# Patient Record
Sex: Female | Born: 2000 | Race: Black or African American | Hispanic: No | Marital: Single | Smoking: Never smoker
Health system: Southern US, Community
[De-identification: ages and names within clinical notes are randomized; demographics above are authoritative.]

---

## 2020-07-08 ENCOUNTER — Encounter (HOSPITAL_COMMUNITY): Payer: Self-pay | Admitting: Emergency Medicine

## 2020-07-08 ENCOUNTER — Emergency Department (HOSPITAL_COMMUNITY)
Admission: EM | Admit: 2020-07-08 | Discharge: 2020-07-08 | Disposition: A | Discharge status: Home / Self Care | Payer: BC Managed Care – PPO | Attending: Emergency Medicine | Admitting: Emergency Medicine

## 2020-07-08 ENCOUNTER — Other Ambulatory Visit: Payer: Self-pay

## 2020-07-08 ENCOUNTER — Emergency Department (HOSPITAL_COMMUNITY): Payer: BC Managed Care – PPO

## 2020-07-08 DIAGNOSIS — R0789 Other chest pain: Secondary | ICD-10-CM | POA: Diagnosis present

## 2020-07-08 DIAGNOSIS — R030 Elevated blood-pressure reading, without diagnosis of hypertension: Secondary | ICD-10-CM | POA: Insufficient documentation

## 2020-07-08 DIAGNOSIS — E876 Hypokalemia: Secondary | ICD-10-CM | POA: Diagnosis not present

## 2020-07-08 LAB — CBC
HCT: 44 % (ref 36.0–46.0)
Hemoglobin: 14.4 g/dL (ref 12.0–15.0)
MCH: 28.7 pg (ref 26.0–34.0)
MCHC: 32.7 g/dL (ref 30.0–36.0)
MCV: 87.8 fL (ref 80.0–100.0)
Platelets: 184 10*3/uL (ref 150–400)
RBC: 5.01 MIL/uL (ref 3.87–5.11)
RDW: 12.5 % (ref 11.5–15.5)
WBC: 4.5 10*3/uL (ref 4.0–10.5)
nRBC: 0 % (ref 0.0–0.2)

## 2020-07-08 LAB — BASIC METABOLIC PANEL
Anion gap: 12 (ref 5–15)
BUN: 9 mg/dL (ref 6–20)
CO2: 23 mmol/L (ref 22–32)
Calcium: 9 mg/dL (ref 8.9–10.3)
Chloride: 102 mmol/L (ref 98–111)
Creatinine, Ser: 0.65 mg/dL (ref 0.44–1.00)
GFR, Estimated: >60 mL/min (ref >60)
Glucose, Bld: 99 mg/dL (ref 70–99)
Potassium: 3 mmol/L — ABNORMAL LOW (ref 3.5–5.1)
Sodium: 137 mmol/L (ref 135–145)

## 2020-07-08 LAB — I-STAT BETA HCG BLOOD, ED (MC, WL, AP ONLY): I-stat hCG, quantitative: <5 m[IU]/mL (ref <5)

## 2020-07-08 LAB — TROPONIN I (HIGH SENSITIVITY)
Troponin I (High Sensitivity): 5 ng/L (ref <18)
Troponin I (High Sensitivity): 2 ng/L (ref <18)

## 2020-07-08 MED ORDER — MAGNESIUM OXIDE 400 (241.3 MG) MG PO TABS
400.0000 mg | ORAL_TABLET | Freq: Once | ORAL | Status: AC
  Administered 2020-07-08: 400 mg via ORAL
  Filled 2020-07-08: qty 1

## 2020-07-08 MED ORDER — POTASSIUM CHLORIDE CRYS ER 20 MEQ PO TBCR
40.0000 meq | EXTENDED_RELEASE_TABLET | Freq: Once | ORAL | Status: AC
  Administered 2020-07-08: 40 meq via ORAL
  Filled 2020-07-08: qty 2

## 2020-07-08 NOTE — ED Provider Notes (Signed)
[facility]-EMERGENCY DEPT Provider Note   CSN: [id number] Arrival date & time: 07/08/20  0746     History Chief Complaint  Patient presents with  . Chest Pain  . Hypertension    Leah Parks is a 19 y.o. female who presents for hypertension and chest pain.  Patient states that she has been see the doctor twice this week because of high blood pressure.  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  She denies a family history of early myocardial infarction or CAD, she denies hypertension, hyperlipidemia, diabetes, smoking.  She denies unilateral leg swelling, pleuritic chest pain, use of exogenous estrogens, recent confinement or surgery.  She states that she feels tightness across the precordium.  Today she had a sharp, fleeting pain on the right side that brought her in.  HPI     History reviewed. No pertinent past medical history.  There are no problems to display for this patient.   History reviewed. No pertinent surgical history.   OB History   No obstetric history on file.     No family history on file.  Social History   Tobacco Use  . Smoking status: Never Smoker  . Smokeless tobacco: Never Used  Substance Use Topics  . Alcohol use: Yes    Comment: socially on weekends  . Drug use: Not Currently    Home Medications Prior to Admission medications   Not on File    Allergies    Patient has no known allergies.  Review of Systems   Review of Systems Ten systems reviewed and are negative for acute change, except as noted in the HPI.   Physical Exam Updated Vital Signs BP (!) 167/106   Pulse 86   Temp 99.3 F (37.4 C) (Oral)   Resp (!) 21   LMP 05/16/2020   SpO2 100%   Physical  Exam Vitals and nursing note reviewed.  Constitutional:      General: She is not in acute distress.    Appearance: She is well-developed. She is not diaphoretic.  HENT:     Head: Normocephalic and atraumatic.  Eyes:     General: No scleral icterus.    Conjunctiva/sclera: Conjunctivae normal.  Cardiovascular:     Rate and Rhythm: Normal rate and regular rhythm.     Heart sounds: Normal heart sounds. No murmur heard.  No friction rub. No gallop.   Pulmonary:     Effort: Pulmonary effort is normal. No respiratory distress.     Breath sounds: Normal breath sounds.  Abdominal:     General: Bowel sounds are normal. There is no distension.     Palpations: Abdomen is soft. There is no mass.     Tenderness: There is no abdominal tenderness. There is no guarding.  Musculoskeletal:     Cervical back: Normal range of motion.  Skin:    General: Skin is warm and dry.  Neurological:     Mental Status: She is alert and oriented to person, place, and time.  Psychiatric:        Behavior: Behavior normal.     ED Results / Procedures / Treatments   Labs (all labs ordered are listed, but only abnormal results are displayed) Labs Reviewed  CBC  BASIC METABOLIC PANEL  I-STAT BETA HCG BLOOD, ED ([facility], [facility], [facility] ONLY)  TROPONIN I (HIGH SENSITIVITY)    EKG None  Radiology DG Chest 2 View  Result Date: 07/08/2020 CLINICAL DATA:  Chest pain. EXAM: CHEST - 2 VIEW COMPARISON:  None. FINDINGS: The heart size and mediastinal contours are within normal limits. Both lungs are clear. No pneumothorax or pleural effusion is noted. The visualized skeletal structures are unremarkable. IMPRESSION: No active cardiopulmonary disease. Electronically Signed   By: Lupita Raider M.D.   On: 07/08/2020 08:17    Procedures Procedures (including critical care time)  Medications Ordered in ED Medications - No data to display  ED Course  I have reviewed the triage vital signs and the nursing notes.  Pertinent  labs & imaging results that were available during my care of the patient were reviewed by me and considered in my medical decision making (see chart for details).  Clinical Course as of Jul 08 910  Tue Jul 08, 2020  0911 Potassium(!): 3.0 [AH]    Clinical Course User Index [AH] Arthor Captain, PA-C   MDM Rules/Calculators/A&P                          Given the large differential diagnosis for [facility], the decision making in this case is of high complexity.  After evaluating all of the data points in this case, the presentation of Leah Parks is NOT consistent with Acute Coronary Syndrome (ACS) and/or myocardial ischemia, pulmonary embolism, aortic dissection; Borhaave's, significant arrythmia, pneumothorax, cardiac tamponade, or other emergent cardiopulmonary condition.  Further, the presentation of Leah Parks is NOT consistent with pericarditis, myocarditis, cholecystitis, pancreatitis, mediastinitis, endocarditis, new valvular disease.  Additionally, the presentation of Leah Parks NOT consistent with flail chest, cardiac contusion, ARDS, or significant intra-thoracic or intra-abdominal bleeding.  Moreover, this presentation is NOT consistent with pneumonia, sepsis, or pyelonephritis.  The patient has a work up in the ED that is benign. CBC shows no significant abnormality.  BMP shows low potassium repleted here in the emergency department.  Troponin is negative x2, negative pregnancy test.  Chest x-ray shows no significant abnormalities.  I personally reviewed these images.  EKG shows left ventricular hypertrophy without acute ischemic abnormalities.   Strict return and follow-up precautions have been given by me personally or by detailed written instruction given verbally by nursing staff using the teach back method to the patient/family/caregiver(s).  Data Reviewed/Counseling: I have reviewed the patient's vital signs, nursing  notes, and other relevant tests/information. I had a detailed discussion regarding the historical points, exam findings, and any diagnostic results supporting the discharge diagnosis. I also discussed the need for outpatient follow-up and the need to return to the ED if symptoms worsen or if there are any questions or concerns that arise at home.  Final Clinical Impression(s) / ED Diagnoses Final diagnoses:  Atypical chest pain  Hypokalemia  Elevated blood pressure reading    Rx / DC Orders ED Discharge Orders    None       Arthor Captain, PA-C 07/08/20 1053    Sabas Sous, MD 07/08/20 1520

## 2020-07-08 NOTE — Discharge Instructions (Addendum)
Please read the attached documents about your diagnoses. Your work up was otherwise benign and you do not appear to have an emergent cause of your symptoms.   Your caregiver has diagnosed you as having chest pain that is not specific for one problem, but does not require admission.  You are at low risk for an acute heart condition or other serious illness. Chest pain comes from many different causes.  SEEK IMMEDIATE MEDICAL ATTENTION IF: You have severe chest pain, especially if the pain is crushing or pressure-like and spreads to the arms, back, neck, or jaw, or if you have sweating, nausea (feeling sick to your stomach), or shortness of breath. THIS IS AN EMERGENCY. Don't wait to see if the pain will go away. Get medical help at once. Call 911 or [phone] (operator). DO NOT drive yourself to the hospital.  Your chest pain gets worse and does not go away with rest.  You have an attack of chest pain lasting longer than usual, despite rest and treatment with the medications your caregiver has prescribed.  You wake from sleep with chest pain or shortness of breath.  You feel dizzy or faint.  You have chest pain not typical of your usual pain for which you originally saw your caregiver.

## 2020-07-08 NOTE — ED Triage Notes (Signed)
Patient complains of upper chest tightness bilaterally x1 week. States she has been to her doctor twice and her BP has been high both times, MD told her to keep an eye on it. Denies cough, SOB, N/V/D or fevers.

## 2021-05-02 IMAGING — CR DG CHEST 2V
2 series · 2 of 2 positions shown · non-contrast
Comparison: None.

CLINICAL DATA: Chest pain.

EXAM:
CHEST - 2 VIEW

[w chest pa]
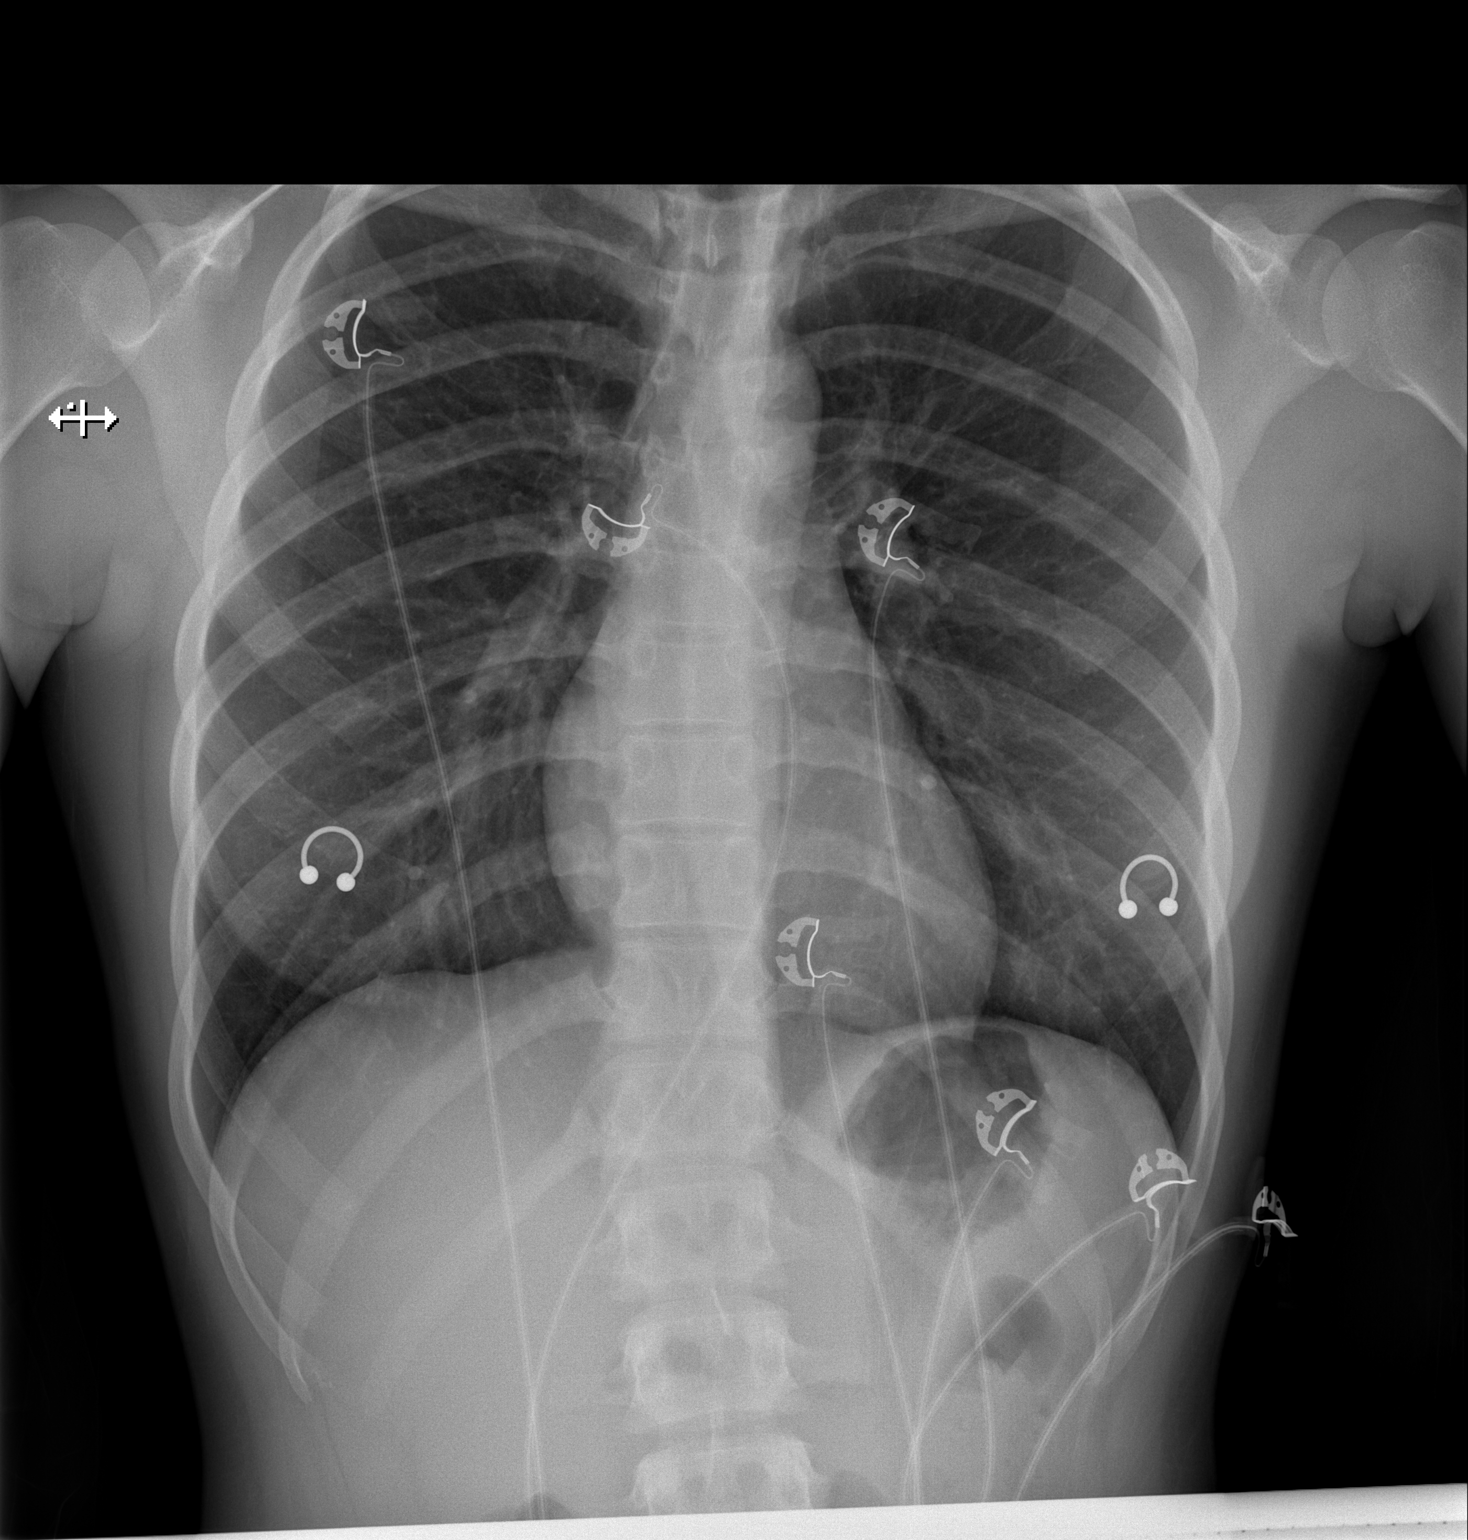

[w chest lat]
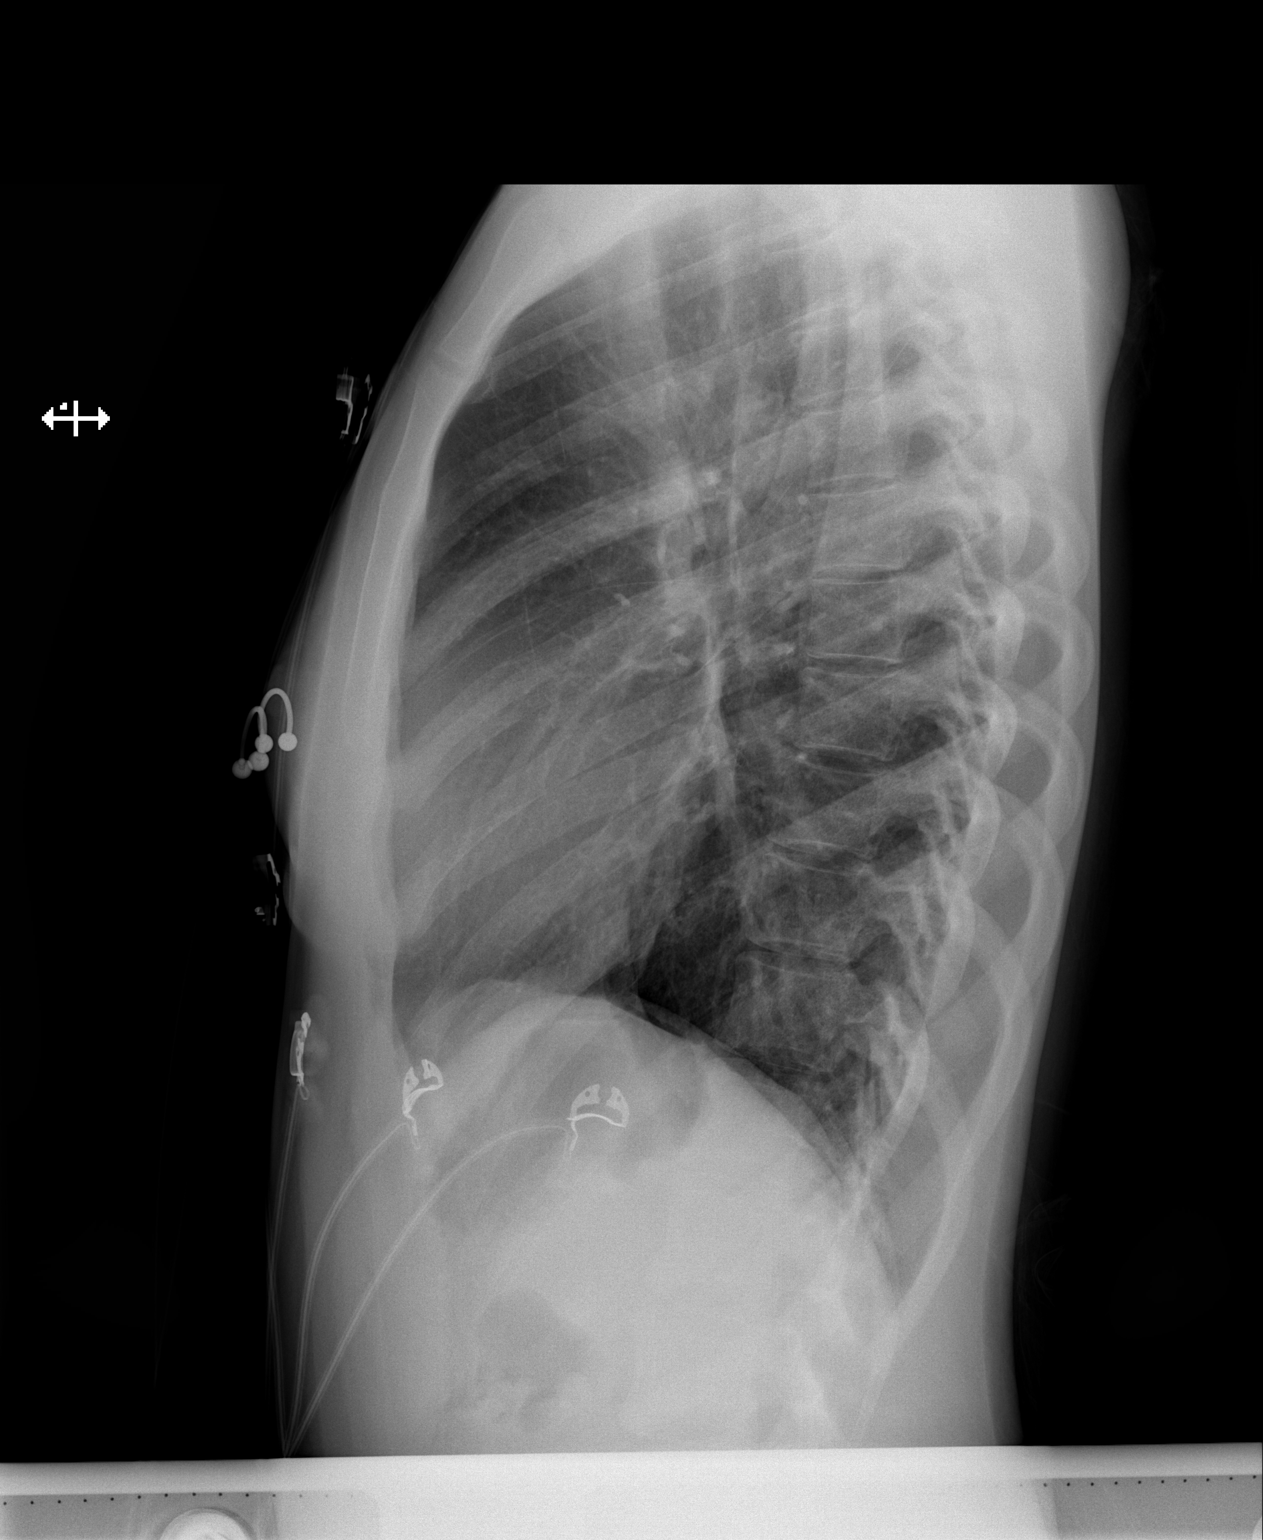

[2 of 2 positions shown; findings below may reference images not displayed]

FINDINGS: The heart size and mediastinal contours are within normal limits.
Both lungs are clear. No pneumothorax or pleural effusion is noted.
The visualized skeletal structures are unremarkable.
IMPRESSION: No active cardiopulmonary disease.
